# Patient Record
Sex: Male | Born: 2000 | Race: White | Hispanic: Yes | Marital: Single | Smoking: Never smoker
Health system: Southern US, Community
[De-identification: ages and names within clinical notes are randomized; demographics above are authoritative.]

---
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
## 2022-12-11 DIAGNOSIS — E039 Hypothyroidism, unspecified: Secondary | ICD-10-CM | POA: Diagnosis not present

## 2022-12-11 DIAGNOSIS — E669 Obesity, unspecified: Secondary | ICD-10-CM | POA: Diagnosis not present

## 2022-12-11 DIAGNOSIS — E063 Autoimmune thyroiditis: Secondary | ICD-10-CM | POA: Diagnosis not present

## 2022-12-11 DIAGNOSIS — Z79890 Hormone replacement therapy (postmenopausal): Secondary | ICD-10-CM | POA: Diagnosis not present

## 2022-12-11 DIAGNOSIS — Z6841 Body Mass Index (BMI) 40.0 and over, adult: Secondary | ICD-10-CM | POA: Diagnosis not present

## 2022-12-11 DIAGNOSIS — F419 Anxiety disorder, unspecified: Secondary | ICD-10-CM | POA: Diagnosis not present
